# Patient Record
Sex: Female | Born: 1955 | Race: White | Hispanic: No | Marital: Single | Smoking: Never smoker
Health system: Southern US, Community
[De-identification: ages and names within clinical notes are randomized; demographics above are authoritative.]

## PROBLEM LIST (undated history)

## (undated) DIAGNOSIS — M8580 Other specified disorders of bone density and structure, unspecified site: Secondary | ICD-10-CM

## (undated) DIAGNOSIS — K589 Irritable bowel syndrome without diarrhea: Secondary | ICD-10-CM

## (undated) DIAGNOSIS — J45909 Unspecified asthma, uncomplicated: Secondary | ICD-10-CM

## (undated) DIAGNOSIS — E079 Disorder of thyroid, unspecified: Secondary | ICD-10-CM

## (undated) DIAGNOSIS — K900 Celiac disease: Secondary | ICD-10-CM

## (undated) HISTORY — DX: Celiac disease: K90.0

## (undated) HISTORY — DX: Unspecified asthma, uncomplicated: J45.909

## (undated) HISTORY — DX: Disorder of thyroid, unspecified: E07.9

## (undated) HISTORY — DX: Irritable bowel syndrome without diarrhea: K58.9

## (undated) HISTORY — DX: Other specified disorders of bone density and structure, unspecified site: M85.80

---

## 1995-06-27 HISTORY — PX: SEPTOPLASTY: SUR1290

## 1997-06-20 HISTORY — PX: ABDOMINAL HYSTERECTOMY: SHX81

## 2009-04-20 HISTORY — PX: ANTERIOR CERVICAL DECOMP/DISCECTOMY FUSION: SHX1161

## 2011-10-19 HISTORY — PX: GALLBLADDER SURGERY: SHX652

## 2014-06-06 HISTORY — PX: ROTATOR CUFF REPAIR: SHX139

## 2020-09-18 ENCOUNTER — Other Ambulatory Visit: Payer: Self-pay | Admitting: Orthopaedic Surgery

## 2020-09-18 DIAGNOSIS — M25571 Pain in right ankle and joints of right foot: Secondary | ICD-10-CM

## 2020-09-24 ENCOUNTER — Ambulatory Visit
Admission: RE | Admit: 2020-09-24 | Discharge: 2020-09-24 | Disposition: A | Discharge status: Home / Self Care | Payer: No Typology Code available for payment source | Source: Ambulatory Visit | Attending: Orthopaedic Surgery | Admitting: Orthopaedic Surgery

## 2020-09-24 DIAGNOSIS — M25571 Pain in right ankle and joints of right foot: Secondary | ICD-10-CM

## 2020-10-27 HISTORY — PX: ANKLE SURGERY: SHX546

## 2020-12-08 ENCOUNTER — Encounter (INDEPENDENT_AMBULATORY_CARE_PROVIDER_SITE_OTHER): Payer: Self-pay | Admitting: Internal Medicine

## 2020-12-08 ENCOUNTER — Other Ambulatory Visit: Payer: Self-pay

## 2020-12-08 ENCOUNTER — Ambulatory Visit (INDEPENDENT_AMBULATORY_CARE_PROVIDER_SITE_OTHER): Payer: No Typology Code available for payment source | Admitting: Internal Medicine

## 2020-12-08 VITALS — BP 116/78 | HR 60 | Temp 96.9°F | Ht 62.0 in | Wt 126.8 lb

## 2020-12-08 DIAGNOSIS — E039 Hypothyroidism, unspecified: Secondary | ICD-10-CM

## 2020-12-08 DIAGNOSIS — G4700 Insomnia, unspecified: Secondary | ICD-10-CM | POA: Diagnosis not present

## 2020-12-08 DIAGNOSIS — R232 Flushing: Secondary | ICD-10-CM

## 2020-12-08 DIAGNOSIS — R635 Abnormal weight gain: Secondary | ICD-10-CM | POA: Diagnosis not present

## 2020-12-08 MED ORDER — ESTRADIOL 0.5 MG PO TABS: 0.5000 mg | ORAL_TABLET | Freq: Every day | ORAL | 1 refills | Status: AC

## 2020-12-08 MED ORDER — PROGESTERONE 200 MG PO CAPS: 200.0000 mg | ORAL_CAPSULE | Freq: Every evening | ORAL | 3 refills | Status: DC

## 2020-12-08 NOTE — Progress Notes (Signed)
Metrics: Intervention Frequency ACO  Documented Smoking Status Yearly  Screened one or more times in 24 months  Cessation Counseling or  Active cessation medication Past 24 months  Past 24 months   Guideline developer: UpToDate (See UpToDate for funding source) Date Released: 2014       Wellness Office Visit  Subjective:  Patient ID: Peggy Navarro, female    DOB: 1955-11-08  Age: 65 y.o. MRN: [id number]  CC: This delightful 65 year old lady comes to our practice as a new patient to establish care. HPI  She is concerned about her hormones.  She had a total hysterectomy at the age of 44 because of endometriosis which involved also removing her ovaries.  After this, she got menopausal symptoms unsurprisingly and has been on estradiol since that time.  She has also been taking testosterone applied to the skin on her thigh over several years.  She has not been taking progesterone but did previously had a dose of progesterone 100 mg at night but this did not seem to help her symptoms of hot flashes which she still gets as well as insomnia. She describes a mental fog, degree of fatigue, is concerned about weight gain. She also did have a history of cold intolerance and NP thyroid has helped her because she was diagnosed with hypothyroidism. 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   Social History   Tobacco Use   Smoking status: Never   Smokeless tobacco: Never  Substance Use Topics   Alcohol use: Yes    Alcohol/week: 1.0 standard drink    Types: 1 Glasses of wine per week    Comment: Occas    Current Meds  Medication Sig   albuterol (VENTOLIN HFA) 108 (90 Base) MCG/ACT inhaler Ventolin HFA 90 mcg/actuation aerosol inhaler  PRN   bifidobacterium infantis (ALIGN) capsule Take 1 capsule by mouth daily.   Cholecalciferol 125 MCG (5000 UT) capsule Take by mouth.   clindamycin (CLEOCIN T) 1 % lotion clindamycin 1 % lotion   doxycycline (VIBRA-TABS) 100 MG tablet Take 100 mg by mouth 2 (two) times daily.   DULoxetine (CYMBALTA) 30 MG capsule Take 1 capsule by mouth daily.   estradiol (ESTRACE) 0.5 MG tablet Take 1 tablet (0.5 mg total) by mouth daily.   estradiol (ESTRACE) 1 MG tablet Take 1 mg by mouth daily.   fluconazole (DIFLUCAN) 150 MG tablet Take by mouth.   fluocinonide cream (LIDEX) 0.05 % Apply twice daily to red area of toes twice daily for 2 weeks. Only apply to affected areas. Never apply to unaffected skin. Never use on face, groin, or within skin folds   fluticasone furoate-vilanterol (BREO ELLIPTA) 100-25 MCG/INH AEPB Inhale 1 puff into the lungs daily.   ipratropium (ATROVENT) 0.06 % nasal spray Place into both nostrils.   Ipratropium-Albuterol (COMBIVENT RESPIMAT)  20-100 MCG/ACT AERS respimat Combivent Respimat 20 mcg-100 mcg/actuation solution for inhalation  as needed   loratadine (CLARITIN) 10 MG tablet Take by mouth.   metroNIDAZOLE (METROGEL) 1 % gel Apply nightly to the lower face   montelukast (SINGULAIR) 10 MG tablet Take by mouth.   MYRBETRIQ 50 MG TB24 tablet Take 50 mg by mouth daily.   NP THYROID 60 MG tablet Take 60 mg by mouth daily.   polyethylene glycol powder (GLYCOLAX/MIRALAX) 17 GM/SCOOP powder Take by mouth.   progesterone (PROMETRIUM) 200 MG capsule Take 1 capsule (200 mg total) by mouth at bedtime.    SUMAtriptan (IMITREX) 100 MG tablet TAKE 1 TABLET EVERY 2 HOURS AS NEEDED FOR MIGRAINE, MAX 2 PER PER 24 HOURS MAXIMUM 200 MG PER DAY   Testosterone 20.25 MG/ACT (1.62%) GEL testosterone 20.25 mg/1.25 gram (1.62 %) transdermal gel pump  Apply by transdermal route.   valACYclovir (VALTREX) 500 MG tablet Take 500 mg by mouth daily.   [DISCONTINUED] Ivermectin 1 % CREA Soolantra 1 % topical cream       Objective:   Today's Vitals: BP 116/78   Pulse 60   Temp (!) 96.9 F (36.1 C) (Temporal)   Ht 5\' 2"  (1.575 m)   Wt 126 lb 12.8 oz (57.5 kg)   SpO2 97%   BMI 23.19 kg/m  Vitals with BMI 12/08/2020  Height 5\' 2"   Weight 126 lbs 13 oz  BMI 23.19  Systolic 116  Diastolic 78  Pulse 60     Physical Exam  She looks systemically well.  Blood pressure is in good range.     Assessment   1. Weight gain   2. Hot flashes   3. Insomnia, unspecified type   4. Hypothyroidism, adult       Tests ordered No orders of the defined types were placed in this encounter.    Plan: 1.  She will continue with NP thyroid 60 mg daily for the time being for her hypothyroidism. 2.  I recommended that she increase the estradiol to 1.5 mg daily and I have sent a prescription for estradiol 0.5 mg which she can add to the estradiol 1 mg that she already has. 3.  She will start taking progesterone 200 mg at night I have sent this prescription. 4.  I recommended that she apply the testosterone cream to the labia instead of on the skin on her leg.  I told her this will be beneficial in terms of testosterone levels as well as local effects of vagina dryness which she does tend to suffer from. 5.  I will see her in about 6 weeks time to see how she is doing and we will do blood work at that time.  Today I spent 45 minutes with this patient discussing all her symptoms and making further recommendations.     Meds ordered this encounter  Medications   progesterone (PROMETRIUM) 200 MG capsule    Sig:  Take 1 capsule (200 mg total) by mouth at bedtime.    Dispense:  30 capsule    Refill:  3   estradiol (ESTRACE) 0.5 MG tablet    Sig: Take 1 tablet (0.5 mg total) by mouth daily.    Dispense:  90 tablet    Refill:  1     Sabra Sessler 12/10/2020, MD  +

## 2020-12-08 NOTE — Patient Instructions (Signed)
[facility] Dietary Recommendations for Weight Loss What to Avoid Avoid added sugars Often added sugar can be found in processed foods such as many condiments, dry cereals, cakes, cookies, chips, crisps, crackers, candies, sweetened drinks, etc.  Read labels and AVOID/DECREASE use of foods with the following in their ingredient list: Sugar, fructose, high fructose corn syrup, sucrose, glucose, maltose, dextrose, molasses, cane sugar, brown sugar, any type of syrup, agave nectar, etc.   Avoid snacking in between meals Avoid foods made with flour If you are going to eat food made with flour, choose those made with whole-grains; and, minimize your consumption as much as is tolerable Avoid processed foods These foods are generally stocked in the middle of the grocery store. Focus on shopping on the perimeter of the grocery.  Avoid Meat  We recommend following a plant-based diet at [facility]. Thus, we recommend avoiding meat as a general rule. Consider eating beans, legumes, eggs, and/or dairy products for regular protein sources If you plan on eating meat limit to 4 ounces of meat at a time and choose lean options such as Fish, chicken, turkey. Avoid red meat intake such as pork and/or steak What to Include Vegetables GREEN LEAFY VEGETABLES: Kale, spinach, mustard greens, collard greens, cabbage, broccoli, etc. OTHER: Asparagus, cauliflower, eggplant, carrots, peas, Brussel sprouts, tomatoes, bell peppers, zucchini, beets, cucumbers, etc. Grains, seeds, and legumes Beans: kidney beans, black eyed peas, garbanzo beans, black beans, pinto beans, etc. Whole, unrefined grains: brown rice, barley, bulgur, oatmeal, etc. Healthy fats  Avoid highly processed fats such as vegetable oil Examples of healthy fats: avocado, olives, virgin olive oil, dark chocolate (?72% Cocoa), nuts (peanuts, almonds, walnuts, cashews, pecans, etc.) None to Low Intake of Animal Sources of Protein Meat  sources: chicken, turkey, salmon, tuna. Limit to 4 ounces of meat at one time. Consider limiting dairy sources, but when choosing dairy focus on: PLAIN Greek yogurt, cottage cheese, high-protein milk Fruit Choose berries  When to Eat Intermittent Fasting: Choosing not to eat for a specific time period, but DO FOCUS ON HYDRATION when fasting Multiple Techniques: Time Restricted Eating: eat 3 meals in a day, each meal lasting no more than 60 minutes, no snacks between meals 16-18 hour fast: fast for 16 to 18 hours up to 7 days a week. Often suggested to start with 2-3 nonconsecutive days per week.  Remember the time you sleep is counted as fasting.  Examples of eating schedule: Fast from 7:00pm-11:00am. Eat between 11:00am-7:00pm.  24-hour fast: fast for 24 hours up to every other day. Often suggested to start with 1 day per week Remember the time you sleep is counted as fasting Examples of eating schedule:  Eating day: eat 2-3 meals on your eating day. If doing 2 meals, each meal should last no more than 90 minutes. If doing 3 meals, each meal should last no more than 60 minutes. Finish last meal by 7:00pm. Fasting day: Fast until 7:00pm.  IF YOU FEEL UNWELL FOR ANY REASON/IN ANY WAY WHEN FASTING, STOP FASTING BY EATING A NUTRITIOUS SNACK OR LIGHT MEAL ALWAYS FOCUS ON HYDRATION DURING FASTS Acceptable Hydration sources: water, broths, tea/coffee (black tea/coffee is best but using a small amount of whole-fat dairy products in coffee/tea is acceptable).  Poor Hydration Sources: anything with sugar or artificial sweeteners added to it  These recommendations have been developed for patients that are actively receiving medical care from either Dr. Bena Navarro or Peggy Gray, DNP, NP-C at [facility]. These recommendations   are developed for patients with specific medical conditions and are not meant to be distributed or used by others that are not actively receiving care from either provider  listed above at [facility]. It is not appropriate to participate in the above eating plans without proper medical supervision.   Reference: Fung, J. The obesity code. [place]: [place]; 2016.   

## 2020-12-31 ENCOUNTER — Other Ambulatory Visit (INDEPENDENT_AMBULATORY_CARE_PROVIDER_SITE_OTHER): Payer: Self-pay | Admitting: Internal Medicine

## 2020-12-31 ENCOUNTER — Encounter (INDEPENDENT_AMBULATORY_CARE_PROVIDER_SITE_OTHER): Payer: Self-pay | Admitting: Internal Medicine

## 2020-12-31 ENCOUNTER — Telehealth (INDEPENDENT_AMBULATORY_CARE_PROVIDER_SITE_OTHER): Payer: Self-pay

## 2020-12-31 DIAGNOSIS — R232 Flushing: Secondary | ICD-10-CM

## 2020-12-31 NOTE — Telephone Encounter (Signed)
Pt called and stated she is having some night sweats. Have this feeling in morning  1 progesterone 200 mg@ night. 1.5 mg estradiol in the day.  Clammy in mornings sweating in morning and night. Please advise. She thinks she may have blood work soon. Aug-15.

## 2020-12-31 NOTE — Telephone Encounter (Signed)
Since I just increase the dose, I would recommend that she just persevere with the same doses for right now and when I see her in about a month's time, we will see how she is feeling and check blood levels.

## 2020-12-31 NOTE — Telephone Encounter (Signed)
If she calls can you schedule her with Dr Reece Agar.

## 2020-12-31 NOTE — Telephone Encounter (Signed)
Okay, seen other note as well on mychart message. Forward message to Clydie Braun as well.

## 2021-01-04 ENCOUNTER — Other Ambulatory Visit (INDEPENDENT_AMBULATORY_CARE_PROVIDER_SITE_OTHER): Payer: No Typology Code available for payment source

## 2021-01-04 ENCOUNTER — Other Ambulatory Visit: Payer: Self-pay

## 2021-01-06 ENCOUNTER — Other Ambulatory Visit (INDEPENDENT_AMBULATORY_CARE_PROVIDER_SITE_OTHER): Payer: Self-pay | Admitting: Internal Medicine

## 2021-01-06 ENCOUNTER — Encounter (INDEPENDENT_AMBULATORY_CARE_PROVIDER_SITE_OTHER): Payer: Self-pay | Admitting: Internal Medicine

## 2021-01-06 MED ORDER — PROGESTERONE 200 MG PO CAPS: 400.0000 mg | ORAL_CAPSULE | Freq: Every evening | ORAL | 3 refills | Status: AC

## 2021-01-07 ENCOUNTER — Encounter (INDEPENDENT_AMBULATORY_CARE_PROVIDER_SITE_OTHER): Payer: Self-pay

## 2021-01-07 LAB — PROGESTERONE: Progesterone: 13.1 ng/mL

## 2021-01-07 LAB — TESTOS,TOTAL,FREE AND SHBG (FEMALE)
Free Testosterone: 4.6 pg/mL (ref 0.1–6.4)
Sex Hormone Binding: 82 nmol/L — ABNORMAL HIGH (ref 14–73)
Testosterone, Total, LC-MS-MS: 53 ng/dL — ABNORMAL HIGH (ref 2–45)

## 2021-01-07 LAB — ESTRADIOL: Estradiol: 62 pg/mL

## 2021-01-19 ENCOUNTER — Encounter (INDEPENDENT_AMBULATORY_CARE_PROVIDER_SITE_OTHER): Payer: Self-pay

## 2021-02-01 ENCOUNTER — Ambulatory Visit (INDEPENDENT_AMBULATORY_CARE_PROVIDER_SITE_OTHER): Payer: No Typology Code available for payment source | Admitting: Internal Medicine

## 2022-12-03 IMAGING — CT CT FOOT*R* W/O CM
3 series · 9 of 33 positions shown, 10 images · non-contrast
Comparison: None.

CLINICAL DATA: Right ankle pain and swelling for 2 years. No
previous relevant surgery.

EXAM:
CT OF THE RIGHT FOOT WITHOUT CONTRAST
TECHNIQUE: Multidetector CT imaging of the right foot was performed according
to the standard protocol. Multiplanar CT image reconstructions were
also generated.

[Series 5: sfov lower extremity 2.00 br40 s3 soft · axial · 0.27mm/px · z∈[+499,+499]mm · 1 of 58 slices shown, 2 images (1 of 3)]
[im 31/58  soft-tissue]
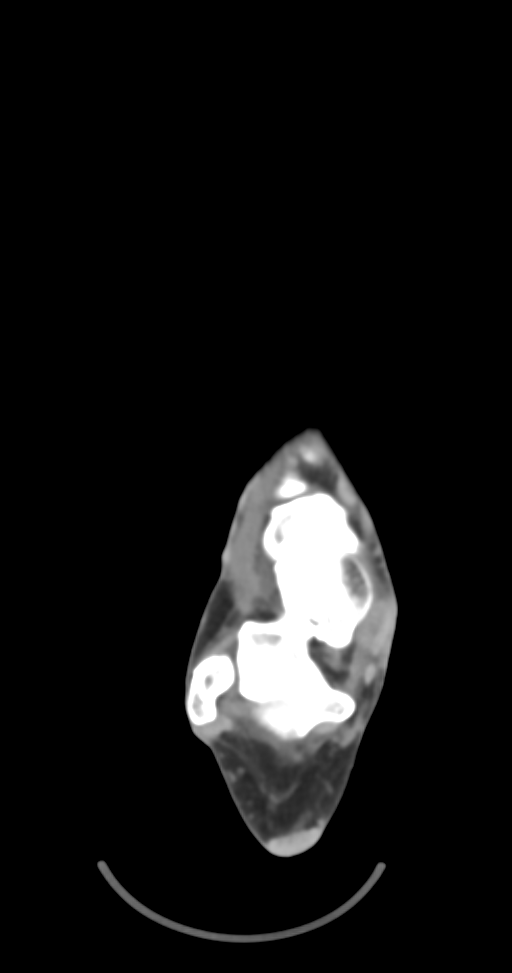
[im 31/58  bone]
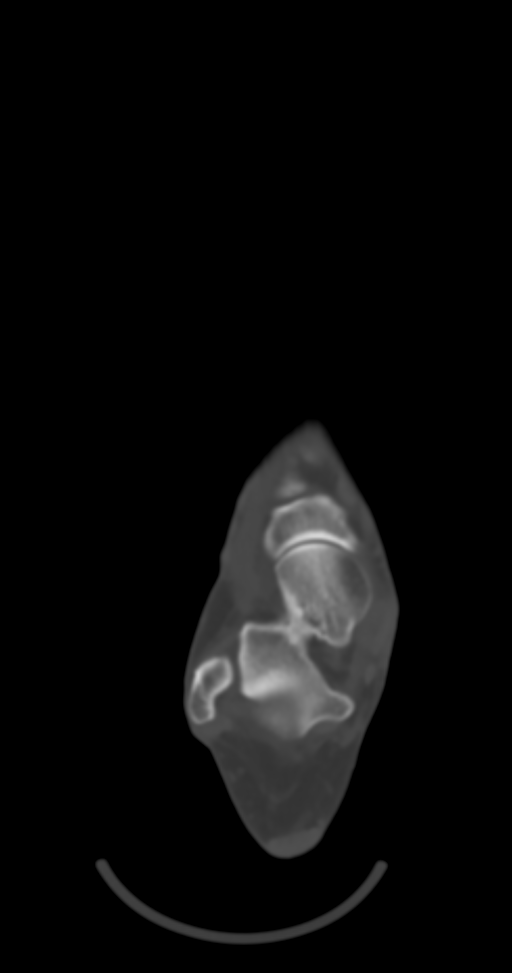

[Series 9: sfov lower extremity 2.00 br40 s3 soft · coronal · 0.22mm/px · 3 of 122 slices shown (2 of 3)]
[im 25/122  bone]
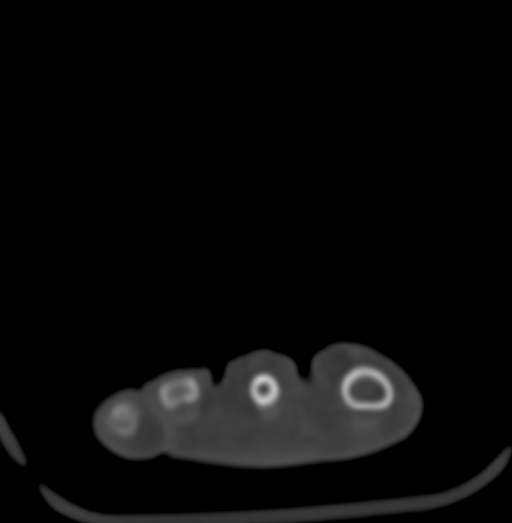
[im 49/122  bone]
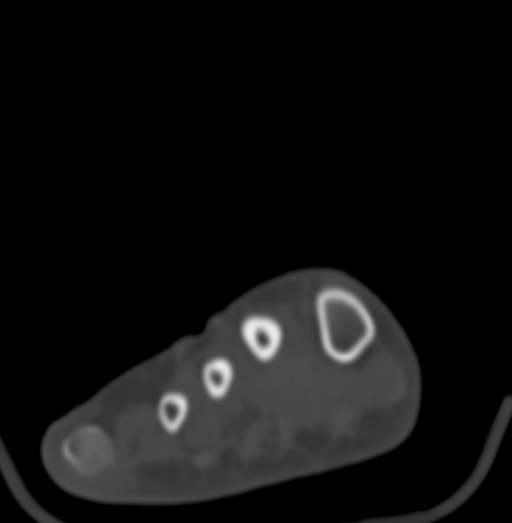
[im 73/122  bone]
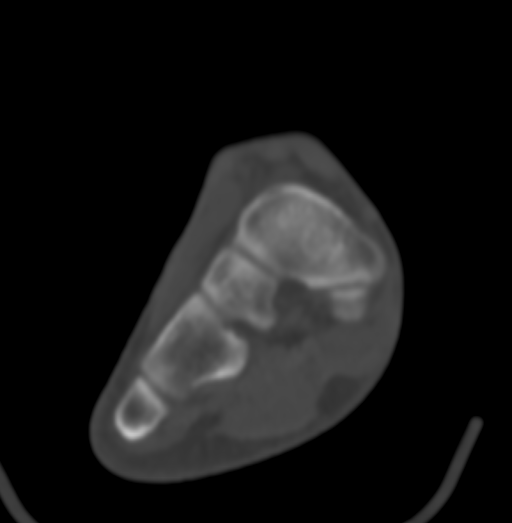

[Series 13: sfov lower extremity 2.00 br40 s3 soft · sagittal · 0.22mm/px · 5 of 48 slices shown (3 of 3)]
[im 16/48  bone]
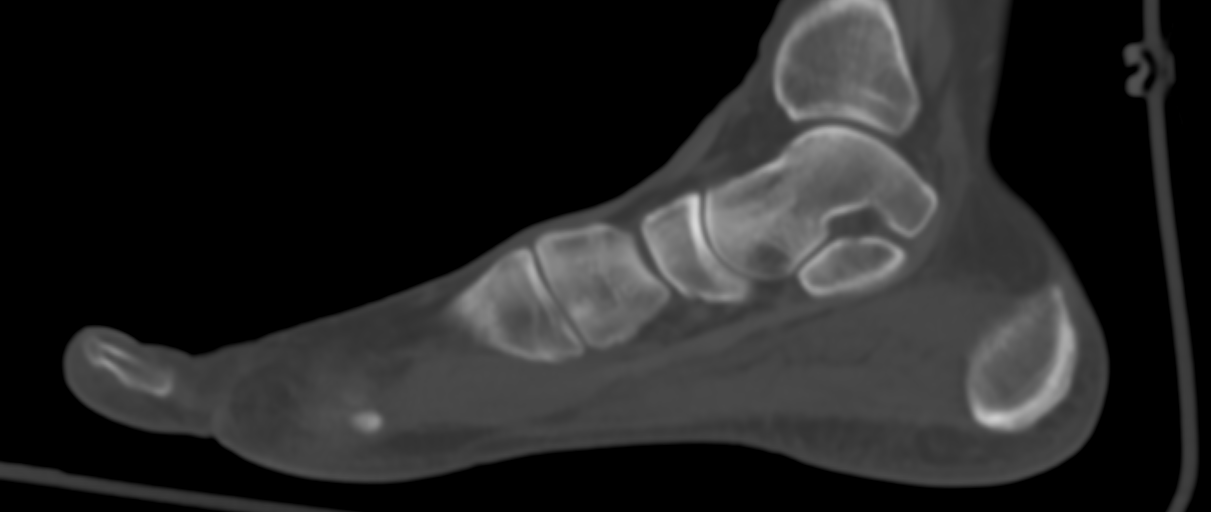
[im 20/48  bone]
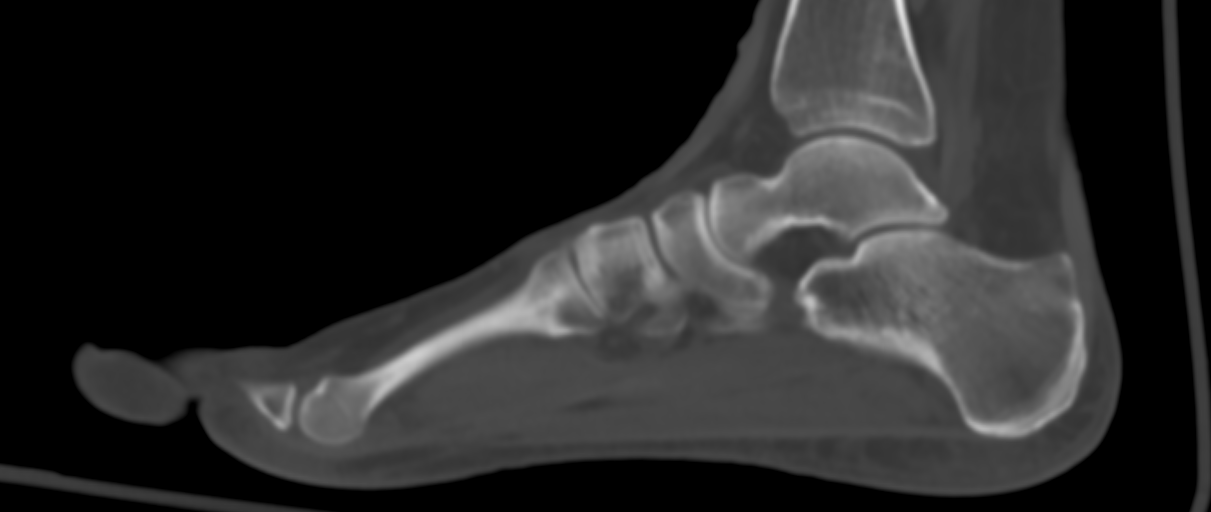
[im 24/48  bone]
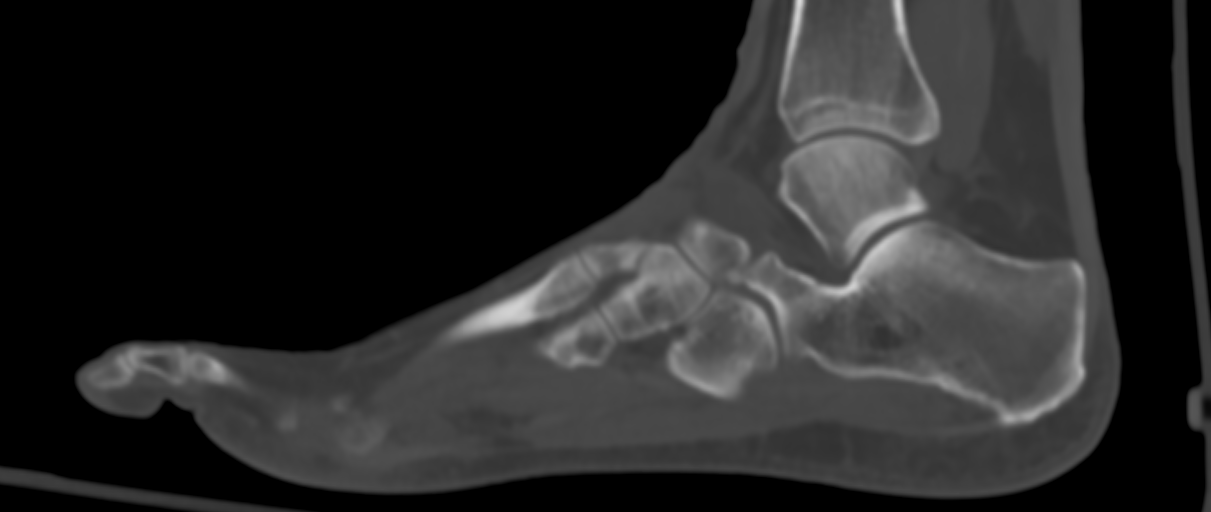
[im 28/48  bone]
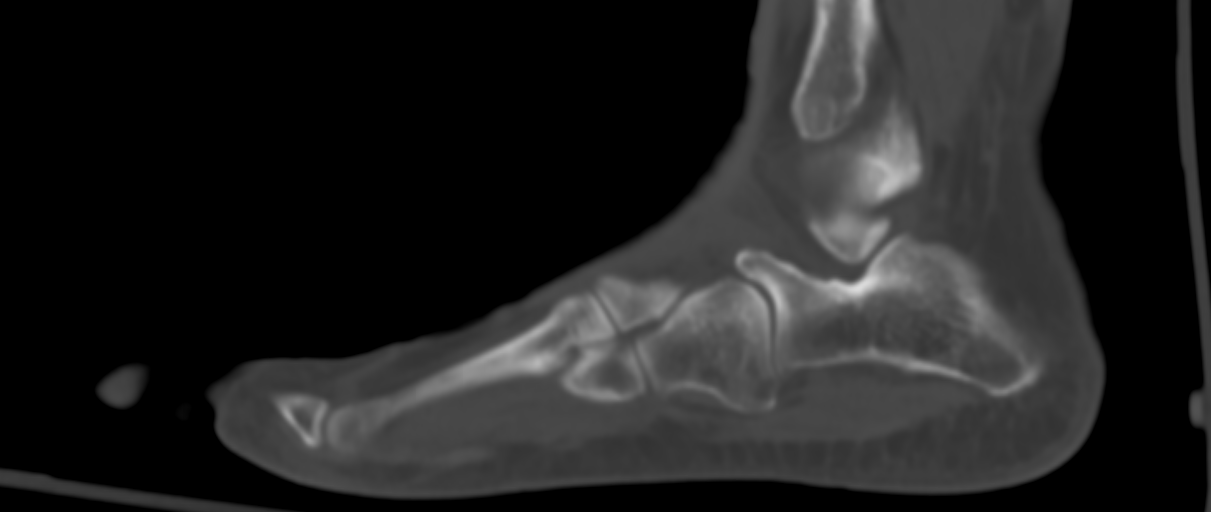
[im 32/48  bone]
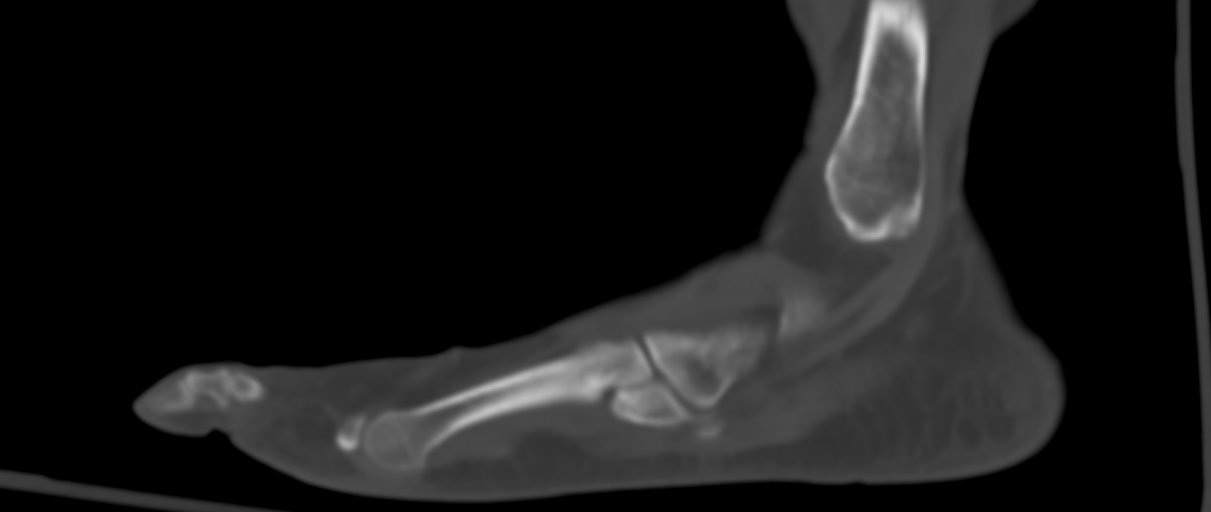

[9 of 33 positions shown; findings below may reference images not displayed]

FINDINGS: Bones/Joint/Cartilage

No evidence of acute fracture or dislocation. There is mild spurring
of medial malleolus. The talar dome and tibial plafond appear
normal. No significant ankle joint effusion. The anterior process of
the calcaneus and adjacent navicular are closely approximated with
an irregular interface which could indicate fibrocartilaginous
coalition. There is no osseous coalition or associated dorsal talar
beaking.

Ligaments

Suboptimally assessed by CT.

Muscles and Tendons

The ankle and foot tendons appear intact as evaluated by CT. No
focal muscular abnormality.

Soft tissues

No inflammatory changes, soft tissue masses or fluid collections. No
evidence of foreign body or soft tissue emphysema. Probable
incidental pressure lesion plantar to the 5th metatarsal head.
IMPRESSION: 1. No acute findings or clear explanation for the patient's
symptoms.
2. Close approximation of the anterior process of the calcaneus and
the navicular with interface irregularity which could indicate
fibrocartilaginous coalition. No significant secondary degenerative
changes.
3. The ankle and foot tendons appear unremarkable.
